# Patient Record
Sex: Male | Born: 1950 | Race: White | Hispanic: No | Marital: Married | Smoking: Never smoker
Health system: Southern US, Community
[De-identification: ages and names within clinical notes are randomized; demographics above are authoritative.]

## PROBLEM LIST (undated history)

## (undated) DIAGNOSIS — I493 Ventricular premature depolarization: Secondary | ICD-10-CM

## (undated) DIAGNOSIS — B1910 Unspecified viral hepatitis B without hepatic coma: Secondary | ICD-10-CM

## (undated) HISTORY — PX: VASECTOMY REVERSAL: SHX243

## (undated) HISTORY — PX: HERNIA REPAIR: SHX51

## (undated) HISTORY — PX: VASECTOMY: SHX75

---

## 2016-06-01 ENCOUNTER — Encounter: Payer: Self-pay | Admitting: *Deleted

## 2016-06-01 ENCOUNTER — Ambulatory Visit (INDEPENDENT_AMBULATORY_CARE_PROVIDER_SITE_OTHER): Payer: Worker's Compensation

## 2016-06-01 ENCOUNTER — Ambulatory Visit
Admission: EM | Admit: 2016-06-01 | Discharge: 2016-06-01 | Disposition: A | Discharge status: Home / Self Care | Payer: Worker's Compensation | Attending: Internal Medicine | Admitting: Internal Medicine

## 2016-06-01 DIAGNOSIS — S66912A Strain of unspecified muscle, fascia and tendon at wrist and hand level, left hand, initial encounter: Secondary | ICD-10-CM | POA: Diagnosis not present

## 2016-06-01 HISTORY — DX: Ventricular premature depolarization: I49.3

## 2016-06-01 NOTE — ED Provider Notes (Signed)
[facility]    CSN: [id number] Arrival date & time: 06/01/16  1220  First Provider Contact:  First MD Initiated Contact with Patient 06/01/16 1326        History   Chief Complaint Chief Complaint  Patient presents with  . Wrist Injury    HPI Benjamin Hood is a 65 y.o. male. He tripped over an obstacle at work and fell on 05/16/16, catching his fall with L hand.  Persistent pain/swelling in ulnar aspect of L hand/wrist. Able to move wrist. No other injuries reported.    HPI  Past Medical History:  Diagnosis Date  . PVC (premature ventricular contraction)      Past Surgical History:  Procedure Laterality Date  . HERNIA REPAIR Right    inguinal       Home Medications    Prior to Admission medications   Medication Sig Start Date End Date Taking? Authorizing Provider  zolpidem (AMBIEN) 10 MG tablet Take 10 mg by mouth at bedtime as needed for sleep.   Yes Historical Provider, MD    Family History History reviewed. No pertinent family history.  Social History Social History  Substance Use Topics  . Smoking status: Never Smoker  . Smokeless tobacco: Never Used  . Alcohol use Yes     Allergies   Review of patient's allergies indicates no known allergies.   Review of Systems Review of Systems  All other systems reviewed and are negative.    Physical Exam Triage Vital Signs ED Triage Vitals  Enc Vitals Group     BP 06/01/16 1248 126/72     Pulse Rate 06/01/16 1248 70     Resp 06/01/16 1248 18     Temp 06/01/16 1248 98.2 F (36.8 C)     Temp Source 06/01/16 1248 Oral     SpO2 06/01/16 1248 98 %     Weight 06/01/16 1250 194 lb (88 kg)     Height 06/01/16 1250 6' (1.829 m)     Pain Score 06/01/16 1255 5   Updated Vital Signs BP 126/72 (BP Location: Left Arm)   Pulse 70   Temp 98.2 F (36.8 C) (Oral)   Resp 18   Ht 6' (1.829 m)   Wt 194 lb (88 kg)   SpO2 98%   BMI 26.31 kg/m  Physical Exam  Constitutional: He is oriented to  person, place, and time. No distress.  Alert, nicely groomed  HENT:  Head: Atraumatic.  Eyes:  Conjugate gaze, no eye redness/drainage  Neck: Neck supple.  Cardiovascular: Normal rate.   Pulmonary/Chest: No respiratory distress.  Abdominal: He exhibits no distension.  Musculoskeletal: Normal range of motion.  Faint swelling/equivocal faint bruising at proximal ulnar hand and ulnar aspect of wrist.  Good ROM at wrist, rotation and flex/extension.  No focal bony tenderness.    Neurological: He is alert and oriented to person, place, and time.  Skin: Skin is warm and dry.  No cyanosis  Nursing note and vitals reviewed.    UC Treatments / Results   Radiology Dg Wrist Complete Left  Result Date: 06/01/2016 CLINICAL DATA:  Tripped over the optic and fell 4 on at outstretched hand. Pain in the medial side of the wrist radiates to pinky finger. Initial encounter. EXAM: LEFT WRIST - COMPLETE 3+ VIEW COMPARISON:  None. FINDINGS: There is no evidence of fracture or dislocation. There is no evidence of arthropathy or other focal bone abnormality. Soft tissues are unremarkable. IMPRESSION: Negative. Electronically Signed  By: Kennith CenterEric  Mansell M.D.   On: 06/01/2016 13:55    Procedures Procedures (including critical care time) Cockup splint applied by nurse  Final Clinical Impressions(s) / UC Diagnoses   Final diagnoses:  Strain of left wrist, initial encounter  trial of splinting x 10d when up and around.  Followup ortho if pain/swelling not improved after that time.  OTC ibuprofen as needed for pain.       Eustace MooreLaura W Tracyann Duffell, MD 06/01/16 1537

## 2016-06-01 NOTE — Discharge Instructions (Signed)
Wear splint when up and around for the next 10 days, to rest it and give it a chance to heal.  If pain/swelling not improving, follow up with orthopedics.  Ibuprofen otc as needed for discomfort.

## 2016-06-01 NOTE — ED Triage Notes (Signed)
Patient injured his left wrist when he fell and caught himself with his left hand. This occurred while at work on 05/16/16. Patient does not have any previous history of left wrist injuries.

## 2016-12-24 ENCOUNTER — Ambulatory Visit
Admission: EM | Admit: 2016-12-24 | Discharge: 2016-12-24 | Disposition: A | Discharge status: Home / Self Care | Payer: BLUE CROSS/BLUE SHIELD | Attending: Family Medicine | Admitting: Family Medicine

## 2016-12-24 ENCOUNTER — Encounter: Payer: Self-pay | Admitting: *Deleted

## 2016-12-24 DIAGNOSIS — H6983 Other specified disorders of Eustachian tube, bilateral: Secondary | ICD-10-CM

## 2016-12-24 DIAGNOSIS — H6692 Otitis media, unspecified, left ear: Secondary | ICD-10-CM

## 2016-12-24 DIAGNOSIS — J0100 Acute maxillary sinusitis, unspecified: Secondary | ICD-10-CM | POA: Diagnosis not present

## 2016-12-24 MED ORDER — LORATADINE-PSEUDOEPHEDRINE ER 10-240 MG PO TB24: 1.0000 | ORAL_TABLET | Freq: Every day | ORAL | 0 refills | Status: DC

## 2016-12-24 MED ORDER — AMOXICILLIN-POT CLAVULANATE 875-125 MG PO TABS: 1.0000 | ORAL_TABLET | Freq: Two times a day (BID) | ORAL | 0 refills | Status: DC

## 2016-12-24 MED ORDER — PREDNISONE 10 MG (21) PO TBPK: ORAL_TABLET | ORAL | 0 refills | Status: DC

## 2016-12-24 NOTE — ED Provider Notes (Signed)
[facility]    CSN: [id number] Arrival date & time: 12/24/16  1110     History   Chief Complaint Chief Complaint  Patient presents with  . Otalgia  . Nasal Congestion    HPI Dallie DadDennis Vantil is a 66 y.o. male.   Patient is a 66 year old white male who is complaining of bilateral ear pains. He states that he started noticing nasal congestion when [place] in [place]. He try to preload himself Friday with some Benadryl before the slight back home. Is a 5 hour flight and with extension and descending part of the slight he was in misery. States the throbbing has continued. Both ears hurt. Feels pressure congestion and still has not improved after landing on Saturday and this is Monday. He takes Flonase on a regular basis is still not been able to improve the symptoms. No known drug allergies. He's had a history of some PVCs. He's had hernia repair and nasal  septal surgery he does not smoke. No one smokes around him and there is no pertinent family medical history relevant to today's visit.        The history is provided by the patient and a significant other. No language interpreter was used.  Otalgia  Location:  Bilateral Behind ear:  No abnormality Quality:  Dull and pressure Severity:  Moderate Timing:  Constant Progression:  Worsening Chronicity:  New Relieved by:  Nothing Worsened by:  Nothing Ineffective treatments:  None tried Associated symptoms: congestion   Associated symptoms: no sore throat and no tinnitus     Past Medical History:  Diagnosis Date  . PVC (premature ventricular contraction)     There are no active problems to display for this patient.   Past Surgical History:  Procedure Laterality Date  . HERNIA REPAIR Right    inguinal       Home Medications    Prior to Admission medications   Medication Sig Start Date End Date Taking? Authorizing Provider  amoxicillin-clavulanate (AUGMENTIN) 875-125 MG tablet Take 1  tablet by mouth 2 (two) times daily. 12/24/16   Hassan RowanEugene Jameia Makris, MD  loratadine-pseudoephedrine (CLARITIN-D 24 HOUR) 10-240 MG 24 hr tablet Take 1 tablet by mouth daily. 12/24/16   Hassan RowanEugene Yashas Camilli, MD  predniSONE (STERAPRED UNI-PAK 21 TAB) 10 MG (21) TBPK tablet Sig 6 tablet day 1, 5 tablets day 2, 4 tablets day 3,,3tablets day 4, 2 tablets day 5, 1 tablet day 6 take all tablets orally 12/24/16   Hassan RowanEugene Marvette Schamp, MD  zolpidem (AMBIEN) 10 MG tablet Take 10 mg by mouth at bedtime as needed for sleep.    Historical Provider, MD    Family History History reviewed. No pertinent family history.  Social History Social History  Substance Use Topics  . Smoking status: Never Smoker  . Smokeless tobacco: Never Used  . Alcohol use Yes     Allergies   Patient has no known allergies.   Review of Systems Review of Systems  Constitutional: Negative for activity change.  HENT: Positive for congestion, ear pain and sneezing. Negative for sore throat, tinnitus, trouble swallowing and voice change.   Eyes: Negative for visual disturbance.  All other systems reviewed and are negative.    Physical Exam Triage Vital Signs ED Triage Vitals  Enc Vitals Group     BP 12/24/16 1211 (!) 132/94     Pulse Rate 12/24/16 1211 (!) 56     Resp 12/24/16 1211 16     Temp 12/24/16 1211  98 F (36.7 C)     Temp Source 12/24/16 1211 Oral     SpO2 12/24/16 1211 99 %     Weight 12/24/16 1213 190 lb (86.2 kg)     Height 12/24/16 1213 5\' 11"  (1.803 m)     Head Circumference --      Peak Flow --      Pain Score --      Pain Loc --      Pain Edu? --      Excl. in GC? --    No data found.   Updated Vital Signs BP (!) 132/94 (BP Location: Left Arm)   Pulse (!) 56   Temp 98 F (36.7 C) (Oral)   Resp 16   Ht 5\' 11"  (1.803 m)   Wt 190 lb (86.2 kg)   SpO2 99%   BMI 26.50 kg/m   Visual Acuity Right Eye Distance:   Left Eye Distance:   Bilateral Distance:    Right Eye Near:   Left Eye Near:    Bilateral Near:       Physical Exam  Constitutional: He appears well-developed and well-nourished.  HENT:  Head: Normocephalic and atraumatic.  Right Ear: Hearing, external ear and ear canal normal. Tympanic membrane is injected.  Left Ear: Hearing, external ear and ear canal normal. Tympanic membrane is injected and erythematous.  Nose: Sinus tenderness present. Right sinus exhibits maxillary sinus tenderness. Right sinus exhibits no frontal sinus tenderness. Left sinus exhibits maxillary sinus tenderness. Left sinus exhibits no frontal sinus tenderness.  Mouth/Throat: Uvula is midline. No uvula swelling. Posterior oropharyngeal erythema present.  Vitals reviewed.    UC Treatments / Results  Labs (all labs ordered are listed, but only abnormal results are displayed) Labs Reviewed - No data to display  EKG  EKG Interpretation None       Radiology No results found.  Procedures Procedures (including critical care time)  Medications Ordered in UC Medications - No data to display   Initial Impression / Assessment and Plan / UC Course  I have reviewed the triage vital signs and the nursing notes.  Pertinent labs & imaging results that were available during my care of the patient were reviewed by me and considered in my medical decision making (see chart for details).   patient uses Flonase nasal spray on regular basis and prednisone 6 day course Augmentin 875 one tablet twice a day and Claritin-D 1 tablet daily for the next 30 days. Patient crown work note will treat for eustachian tube dysfunction sinusitis and the left otitis media    Final Clinical Impressions(s) / UC Diagnoses   Final diagnoses:  Acute maxillary sinusitis, recurrence not specified  Acute left otitis media  Dysfunction of both eustachian tubes    New Prescriptions New Prescriptions   AMOXICILLIN-CLAVULANATE (AUGMENTIN) 875-125 MG TABLET    Take 1 tablet by mouth 2 (two) times daily.   LORATADINE-PSEUDOEPHEDRINE  (CLARITIN-D 24 HOUR) 10-240 MG 24 HR TABLET    Take 1 tablet by mouth daily.   PREDNISONE (STERAPRED UNI-PAK 21 TAB) 10 MG (21) TBPK TABLET    Sig 6 tablet day 1, 5 tablets day 2, 4 tablets day 3,,3tablets day 4, 2 tablets day 5, 1 tablet day 6 take all tablets orally     Note: This dictation was prepared with Dragon dictation along with smaller phrase technology. Any transcriptional errors that result from this process are unintentional.   Hassan Rowan, MD 12/24/16 1418

## 2016-12-24 NOTE — ED Triage Notes (Signed)
Head congestion, bilat ear pain and fullness, headache, since 5 hour flight Friday night.

## 2017-11-15 ENCOUNTER — Ambulatory Visit (INDEPENDENT_AMBULATORY_CARE_PROVIDER_SITE_OTHER): Payer: BLUE CROSS/BLUE SHIELD

## 2017-11-15 ENCOUNTER — Ambulatory Visit
Admission: EM | Admit: 2017-11-15 | Discharge: 2017-11-15 | Disposition: A | Discharge status: Home / Self Care | Payer: BLUE CROSS/BLUE SHIELD | Attending: Family Medicine | Admitting: Family Medicine

## 2017-11-15 ENCOUNTER — Encounter: Payer: Self-pay | Admitting: *Deleted

## 2017-11-15 DIAGNOSIS — S52501A Unspecified fracture of the lower end of right radius, initial encounter for closed fracture: Secondary | ICD-10-CM

## 2017-11-15 DIAGNOSIS — W19XXXA Unspecified fall, initial encounter: Secondary | ICD-10-CM | POA: Diagnosis not present

## 2017-11-15 MED ORDER — TRAMADOL HCL 50 MG PO TABS: 50.0000 mg | ORAL_TABLET | Freq: Three times a day (TID) | ORAL | 0 refills | Status: DC | PRN

## 2017-11-15 NOTE — ED Provider Notes (Signed)
[facility]    CSN: [id number] Arrival date & time: 11/15/17  1802  History   Chief Complaint Chief Complaint  Patient presents with  . Arm Injury   HPI  67 year old male presents with an arm injury.  Patient states that he was moving a log and tripped on a root.  He subsequently fell back and injured his right forearm and wrist.  Pain is predominantly of the right wrist.  Swelling in this area.  Pain reportedly 8/10 in severity.  He iced the area immediately and then came in for evaluation.  Decreased range of motion.  Swelling.  Sensation intact.  Exacerbated by activity.  No relieving factors.  No other associated symptoms.  No other complaints at this time.  Past Medical History:  Diagnosis Date  . PVC (premature ventricular contraction)    Past Surgical History:  Procedure Laterality Date  . HERNIA REPAIR Right    inguinal   Home Medications    Prior to Admission medications   Medication Sig Start Date End Date Taking? Authorizing Provider  traMADol (ULTRAM) 50 MG tablet Take 1 tablet (50 mg total) by mouth every 8 (eight) hours as needed. 11/15/17   Tommie Sams, DO   Family History History reviewed. No pertinent family history.  Social History Social History   Tobacco Use  . Smoking status: Never Smoker  . Smokeless tobacco: Never Used  Substance Use Topics  . Alcohol use: Yes  . Drug use: No    Allergies   Patient has no known allergies.   Review of Systems Review of Systems  Constitutional: Negative.   Musculoskeletal:       Right wrist and forearm pain.  Swelling, decreased range of motion.   Physical Exam Triage Vital Signs ED Triage Vitals  Enc Vitals Group     BP 11/15/17 1818 (!) 143/76     Pulse Rate 11/15/17 1818 84     Resp 11/15/17 1818 12     Temp 11/15/17 1818 98.2 F (36.8 C)     Temp Source 11/15/17 1818 Oral     SpO2 11/15/17 1818 98 %     Weight 11/15/17 1821 193 lb (87.5 kg)     Height 11/15/17 1821 6' (1.829 m)       Head Circumference --      Peak Flow --      Pain Score 11/15/17 1820 8     Pain Loc --      Pain Edu? --      Excl. in GC? --    Updated Vital Signs BP (!) 143/76 (BP Location: Left Leg)   Pulse 84   Temp 98.2 F (36.8 C) (Oral)   Resp 12   Ht 6' (1.829 m)   Wt 193 lb (87.5 kg)   SpO2 98%   BMI 26.18 kg/m  Physical Exam  Constitutional: He is oriented to person, place, and time. He appears well-developed. No distress.  Pulmonary/Chest: Effort normal. No respiratory distress.  Musculoskeletal:  Right wrist -swelling noted on the radial side of the wrist, approximately.  Patient with tenderness palpation of the distal wrist.  Decreased range of motion in flexion and extension secondary to pain.  Patient has some tenderness or along the forearm but no discrete abnormalities.  Neurological: He is alert and oriented to person, place, and time.  Skin: Skin is warm. No rash noted.  Psychiatric: He has a normal mood and affect. His behavior is normal.  Nursing note and  vitals reviewed.  UC Treatments / Results  Labs (all labs ordered are listed, but only abnormal results are displayed) Labs Reviewed - No data to display  EKG  EKG Interpretation None       Radiology Dg Forearm Right  Result Date: 11/15/2017 CLINICAL DATA:  Right wrist pain post fall. EXAM: RIGHT FOREARM - 2 VIEW COMPARISON:  None. FINDINGS: Horizontal minimally displaced mildly comminuted fracture of the distal radius, with possible small intra-articular component. Associated soft tissue swelling. IMPRESSION: Horizontal minimally displaced mildly comminuted fracture of the distal radius, with possible small intra-articular component. Electronically Signed   By: Ted Mcalpineobrinka  Dimitrova M.D.   On: 11/15/2017 19:26   Dg Wrist Complete Right  Result Date: 11/15/2017 CLINICAL DATA:  Radial side of the wrist pain post fall. EXAM: RIGHT WRIST - COMPLETE 3+ VIEW COMPARISON:  None. FINDINGS: Horizontal minimally  displaced mildly comminuted fracture of the distal radius. Small intra-articular component cannot be excluded. The carpal rows are normally aligned. Associated soft tissue swelling. IMPRESSION: Horizontal minimally displaced mildly comminuted fracture of the distal radius, with possible small intra-articular component. Electronically Signed   By: Ted Mcalpineobrinka  Dimitrova M.D.   On: 11/15/2017 19:26    Procedures Procedures (including critical care time)  Medications Ordered in UC Medications - No data to display   Initial Impression / Assessment and Plan / UC Course  I have reviewed the triage vital signs and the nursing notes.  Pertinent labs & imaging results that were available during my care of the patient were reviewed by me and considered in my medical decision making (see chart for details).     67 year old male presents with a distal radius fracture. Placed in splint (sugar tong). Tramadol for pain. Advised to see Ortho (will call Poggi; he is friend).  Final Clinical Impressions(s) / UC Diagnoses   Final diagnoses:  Closed fracture of distal end of right radius, unspecified fracture morphology, initial encounter    ED Discharge Orders        Ordered    traMADol (ULTRAM) 50 MG tablet  Every 8 hours PRN     11/15/17 1941     Controlled Substance Prescriptions [place] Controlled Substance Registry consulted? Not Applicable   Tommie SamsCook, Alaura Schippers G, DO 11/15/17 2019

## 2017-11-15 NOTE — Discharge Instructions (Signed)
Rest.  Medication if needed.  Call Ortho.  Take care  Dr. Adriana Simasook

## 2017-11-15 NOTE — ED Triage Notes (Signed)
Pt felt today around 5 PM and landed on his right arm.  Pt has some swelling to  Right wrist and forearm area. Pain level 8/10.

## 2017-12-31 ENCOUNTER — Ambulatory Visit: Payer: BLUE CROSS/BLUE SHIELD | Attending: Surgery | Admitting: Occupational Therapy

## 2017-12-31 ENCOUNTER — Other Ambulatory Visit: Payer: Self-pay

## 2017-12-31 DIAGNOSIS — M25631 Stiffness of right wrist, not elsewhere classified: Secondary | ICD-10-CM | POA: Insufficient documentation

## 2017-12-31 DIAGNOSIS — M25641 Stiffness of right hand, not elsewhere classified: Secondary | ICD-10-CM | POA: Diagnosis present

## 2017-12-31 DIAGNOSIS — M6281 Muscle weakness (generalized): Secondary | ICD-10-CM | POA: Diagnosis present

## 2017-12-31 NOTE — Therapy (Signed)
[facility] [address], [place], [address] Phone: [phone]   Fax:  [phone]  Occupational Therapy Evaluation  Patient Details  Name: Benjamin Hood MRN: [id number] Date of Birth: 15-Jan-1951 Referring Provider: Joice Lofts   Encounter Date: 12/31/2017  OT End of Session - 12/31/17 1300    Visit Number  1    Number of Visits  12    Date for OT Re-Evaluation  02/11/18    OT Start Time  1156    OT Stop Time  1253    OT Time Calculation (min)  57 min    Activity Tolerance  Patient tolerated treatment well    Behavior During Therapy  [name] for tasks assessed/performed       Past Medical History:  Diagnosis Date  . PVC (premature ventricular contraction)     Past Surgical History:  Procedure Laterality Date  . HERNIA REPAIR Right    inguinal    There were no vitals filed for this visit.  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  No/denies        [facility] OT Assessment - 12/31/17 0001      Assessment   Medical Diagnosis  R distal readius     Referring Provider  Poggi    Onset Date/Surgical Date  11/15/17    Hand Dominance  Right    Next MD Visit  -- about middle April      Precautions   Required Braces or Orthoses  -- wrist splint       Home  Environment   Lives With  Spouse      Prior Function   Vocation  Full time employment      AROM   Right Forearm Pronation  75 Degrees    Right Forearm Supination  45 Degrees    Left Forearm Pronation  90 Degrees    Left Forearm Supination  90 Degrees    Right Wrist Extension  36  Degrees    Right Wrist Flexion  25 Degrees    Right Wrist Radial Deviation  18 Degrees    Right Wrist Ulnar Deviation  14 Degrees    Left Wrist Extension  70 Degrees    Left Wrist Flexion  74 Degrees    Left Wrist Radial Deviation  35 Degrees    Left Wrist Ulnar Deviation  22 Degrees      Right Hand AROM   R Thumb Opposition to Index  -- Opposition to 2nd fold of 5th -pull over thumb    R Index  MCP 0-90  70 Degrees    R Index PIP 0-100  90 Degrees    R Long  MCP 0-90  78 Degrees    R Long PIP 0-100  90 Degrees    R Ring  MCP 0-90  78 Degrees    R Ring PIP 0-100  95 Degrees    R Little  MCP 0-90  85 Degrees  R Little PIP 0-100  95 Degrees       fluidotherapy done for wrist and digits AROM in all planes to increase ROM  Prior to review of HEP      Contrast if possible  Tendon glides   Opposition - and slide down 5th   AAROM over edge of table for wrist flexion , ext, RD,UD  And PROM for supination  10 reps  keep pain under 2/10   AROM  For wrist in all planes   10 reps   2-3 x day             OT Education - 12/31/17 1300    Education provided  Yes    Education Details  findings of eval and HEP     Person(s) Educated  Patient    Methods  Explanation;Demonstration;Tactile cues;Verbal cues;Handout    Comprehension  Returned demonstration;Verbalized understanding       OT Short Term Goals - 12/31/17 1305      OT SHORT TERM GOAL #1   Title  Pt to be ind in HEP to increase AROM in R wrist  and digits to turn doorknob, brush teeth    Baseline  See flowsheet     Time  3    Period  Weeks    Status  New    Target Date  01/21/18      OT SHORT TERM GOAL #2   Title  R wrist AROM improve with more than 10-25 degrees to increase functional use of R hand to 75%     Baseline  USing R hand only 40% of time and see flowsheet for AROM for wrist     Time  4    Period  Weeks    Status  New    Target Date  01/28/18        OT Long Term Goals - 12/31/17 1310       OT LONG TERM GOAL #1   Title  Function score on PRWHE improve with more than 20 points     Baseline  Function score on PRWHE at eval 30/50     Time  6    Period  Weeks    Status  New    Target Date  02/11/18      OT LONG TERM GOAL #2   Title  R grip strength increase to more than 50% compare to L hand to carry more than 8 lbs, cut food, and  start back in working out in gym     Baseline  NT  - cast come off last week - ROM initiated this date     Time  6    Period  Weeks    Status  New    Target Date  02/11/18      OT LONG TERM GOAL #3   Title  R wrist strength increase to 4+/5 or more in all planes to return to using hand in work and gold, playing guitar and yard work without symptoms     Baseline  not using hand in act - only 40%     Time  6    Period  Weeks    Status  New    Target Date  02/11/18            Plan - 12/31/17 1301    Clinical Impression Statement  Pt present at OT evaluation 6 wks out from distal radius fx - was in cast until last week -  and now in velcro brace - pt do report some numbness at night time -  pt show decrease ROM in wrist in all planes , decrease MC and composite flexion of digits - decrease strength - all  limiting his functional use of R dominant hand  in ADL's and IADL's     Occupational performance deficits (Please refer to evaluation for details):  ADL's;IADL's;Work;Play;Leisure    Rehab Potential  Good    OT Frequency  2x / week    OT Duration  6 weeks    OT Treatment/Interventions  Self-care/ADL training;Patient/family education;Paraffin;Contrast Bath;Fluidtherapy;Manual Therapy;Passive range of motion;Therapeutic exercise    Plan   To assess progress with ROM HEP and add strengthening     Clinical Decision Making  Several treatment options, min-mod task modification necessary    OT Home Exercise Plan  see pt instruction    Consulted and Agree with Plan of Care  Patient       Patient will benefit from skilled therapeutic  intervention in order to improve the following deficits and impairments:  Pain, Impaired flexibility, Increased edema, Decreased strength, Decreased range of motion, Impaired UE functional use  Visit Diagnosis: Stiffness of right wrist, not elsewhere classified - Plan: Ot plan of care cert/re-cert  Stiffness of right hand, not elsewhere classified - Plan: Ot plan of care cert/re-cert  Muscle weakness (generalized) - Plan: Ot plan of care cert/re-cert    Problem List There are no active problems to display for this patient.   Oletta CohnuPreez, Benjamin Hood OTR/L,CLT 12/31/2017, 1:16 PM  [facility] [address] [place], [place], [address] Phone: [phone]   Fax:  [phone]  Name: Benjamin Hood MRN: [id number] Date of Birth: 05/18/1951

## 2017-12-31 NOTE — Patient Instructions (Signed)
Contrast if possible  Tendon glides   Opposition - and slide down 5th   AAROM over edge of table for wrist flexion , ext, RD,UD  And PROM for supination  10 reps  keep pain under 2/10   AROM  For wrist in all planes   10 reps   2-3 x day

## 2018-01-10 ENCOUNTER — Ambulatory Visit: Payer: BLUE CROSS/BLUE SHIELD | Admitting: Occupational Therapy

## 2018-01-17 ENCOUNTER — Ambulatory Visit: Payer: BLUE CROSS/BLUE SHIELD | Admitting: Occupational Therapy

## 2018-01-24 ENCOUNTER — Ambulatory Visit: Payer: BLUE CROSS/BLUE SHIELD | Admitting: Occupational Therapy

## 2018-03-30 IMAGING — CR DG WRIST COMPLETE 3+V*R*
4 series · 4 of 4 positions shown · non-contrast
Comparison: None.

CLINICAL DATA: Radial side of the wrist pain post fall.

EXAM:
RIGHT WRIST - COMPLETE 3+ VIEW

[wrist pa]
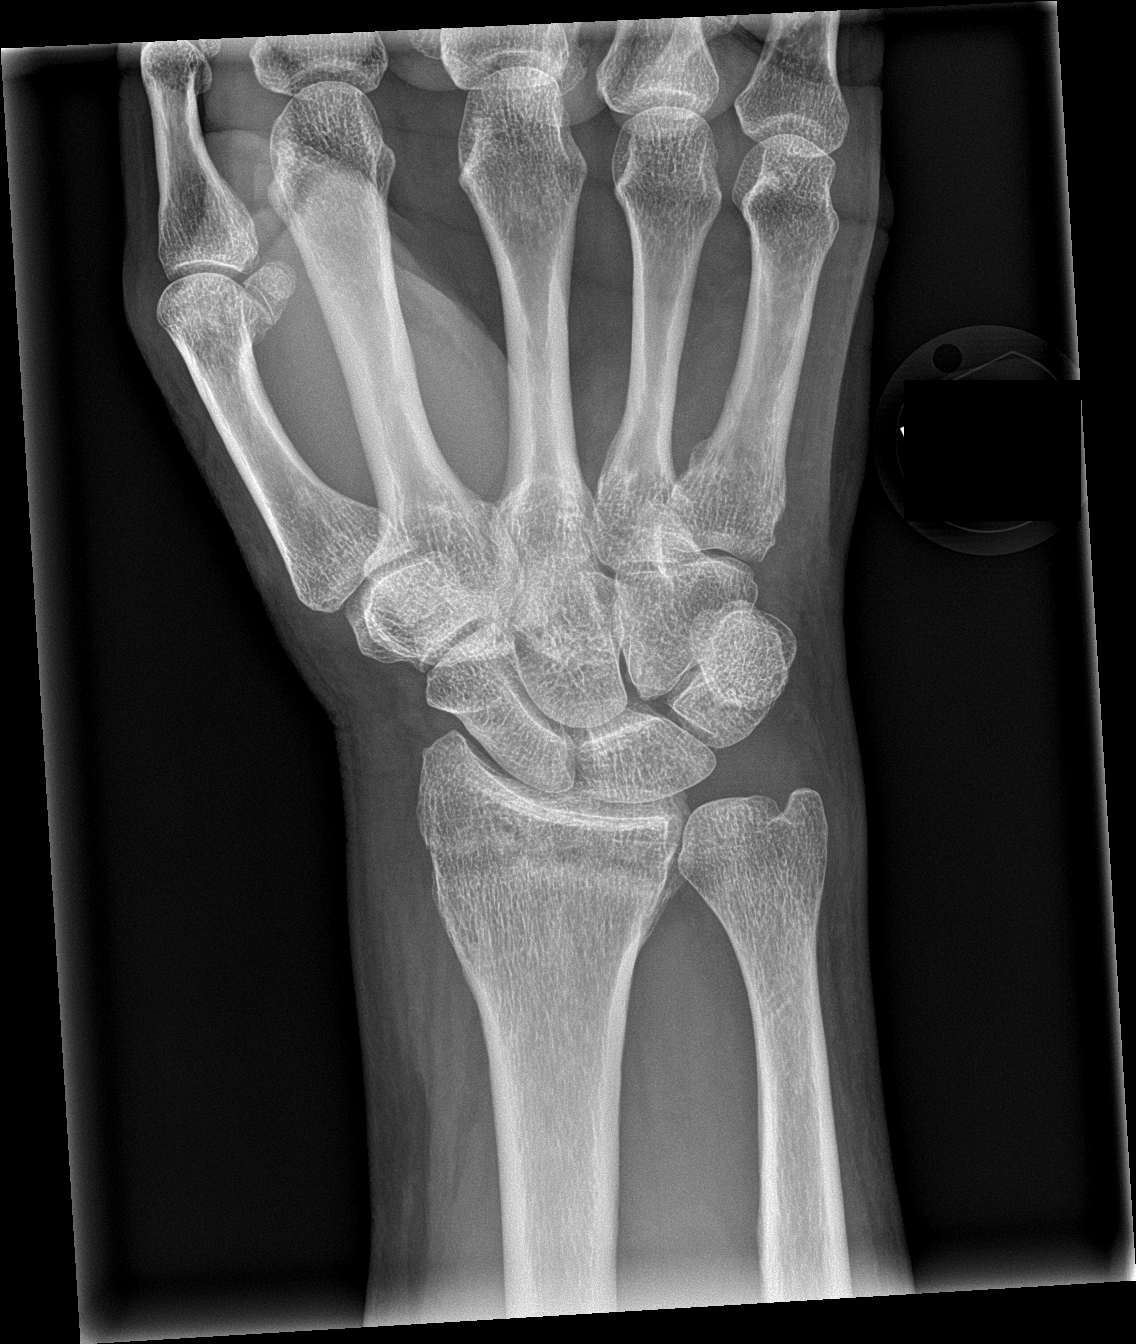

[wrist obl]
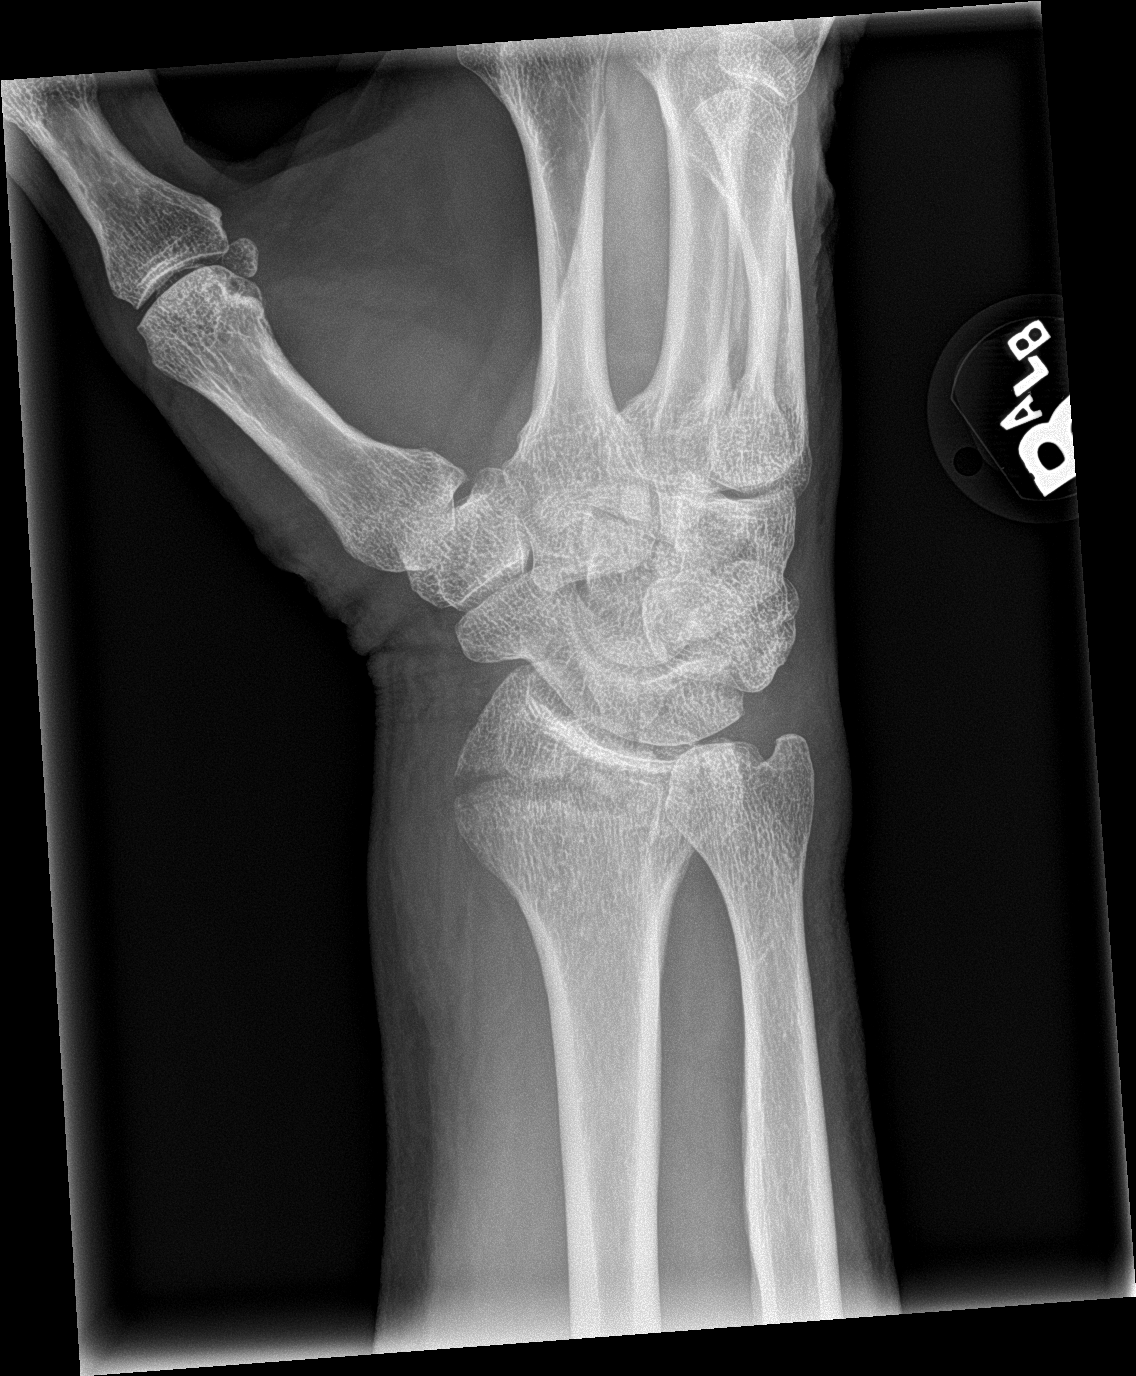

[wrist lat]
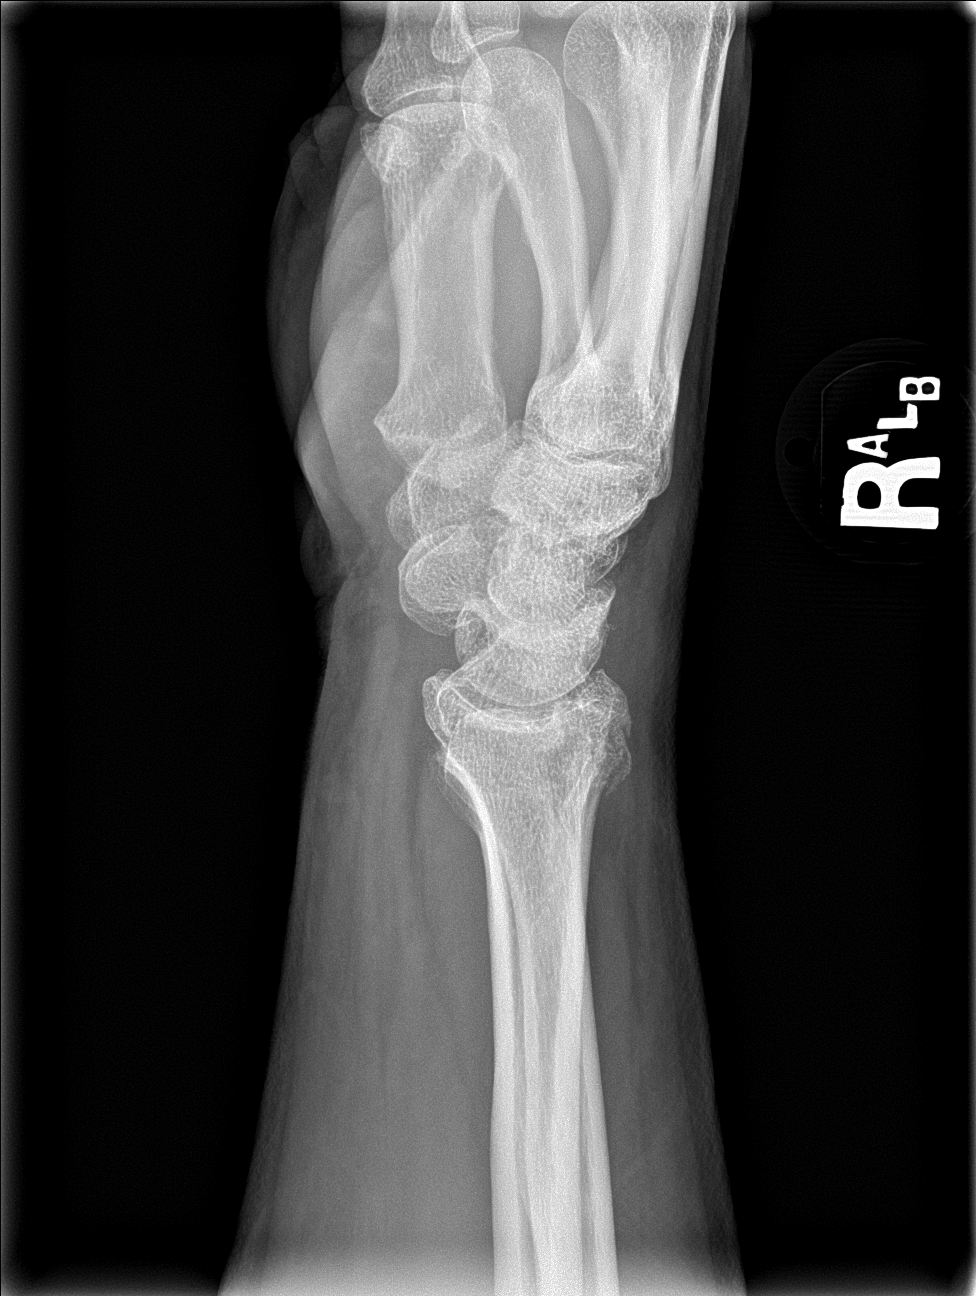

[wrist navicular]
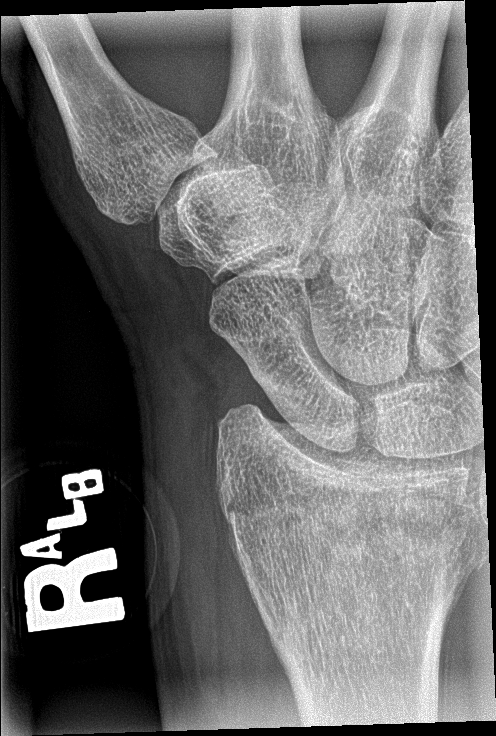

[4 of 4 positions shown; findings below may reference images not displayed]

FINDINGS: Horizontal minimally displaced mildly comminuted fracture of the
distal radius. Small intra-articular component cannot be excluded.
The carpal rows are normally aligned. Associated soft tissue
swelling.
IMPRESSION: Horizontal minimally displaced mildly comminuted fracture of the
distal radius, with possible small intra-articular component.

## 2021-01-09 ENCOUNTER — Other Ambulatory Visit: Payer: Self-pay

## 2021-01-09 ENCOUNTER — Emergency Department: Payer: Medicare HMO

## 2021-01-09 ENCOUNTER — Emergency Department
Admission: EM | Admit: 2021-01-09 | Discharge: 2021-01-09 | Disposition: A | Discharge status: Home / Self Care | Payer: Medicare HMO | Attending: Emergency Medicine | Admitting: Emergency Medicine

## 2021-01-09 ENCOUNTER — Encounter: Payer: Self-pay | Admitting: Emergency Medicine

## 2021-01-09 DIAGNOSIS — R1011 Right upper quadrant pain: Secondary | ICD-10-CM | POA: Diagnosis present

## 2021-01-09 DIAGNOSIS — Z91040 Latex allergy status: Secondary | ICD-10-CM | POA: Diagnosis not present

## 2021-01-09 HISTORY — DX: Unspecified viral hepatitis B without hepatic coma: B19.10

## 2021-01-09 LAB — CBC
HCT: 44.2 % (ref 39.0–52.0)
Hemoglobin: 15.4 g/dL (ref 13.0–17.0)
MCH: 32.2 pg (ref 26.0–34.0)
MCHC: 34.8 g/dL (ref 30.0–36.0)
MCV: 92.5 fL (ref 80.0–100.0)
Platelets: 207 10*3/uL (ref 150–400)
RBC: 4.78 MIL/uL (ref 4.22–5.81)
RDW: 12.4 % (ref 11.5–15.5)
WBC: 5.4 10*3/uL (ref 4.0–10.5)
nRBC: 0 % (ref 0.0–0.2)

## 2021-01-09 LAB — URINALYSIS, COMPLETE (UACMP) WITH MICROSCOPIC
Bacteria, UA: NONE SEEN
Bilirubin Urine: NEGATIVE
Glucose, UA: NEGATIVE mg/dL
Hgb urine dipstick: NEGATIVE
Ketones, ur: NEGATIVE mg/dL
Leukocytes,Ua: NEGATIVE
Nitrite: NEGATIVE
Protein, ur: NEGATIVE mg/dL
Specific Gravity, Urine: 1.019 (ref 1.005–1.030)
Squamous Epithelial / LPF: NONE SEEN (ref 0–5)
pH: 5 (ref 5.0–8.0)

## 2021-01-09 LAB — COMPREHENSIVE METABOLIC PANEL
ALT: 29 U/L (ref 0–44)
AST: 30 U/L (ref 15–41)
Albumin: 4.1 g/dL (ref 3.5–5.0)
Alkaline Phosphatase: 49 U/L (ref 38–126)
Anion gap: 7 (ref 5–15)
BUN: 38 mg/dL — ABNORMAL HIGH (ref 8–23)
CO2: 23 mmol/L (ref 22–32)
Calcium: 9.1 mg/dL (ref 8.9–10.3)
Chloride: 107 mmol/L (ref 98–111)
Creatinine, Ser: 1.35 mg/dL — ABNORMAL HIGH (ref 0.61–1.24)
GFR, Estimated: 57 mL/min — ABNORMAL LOW (ref >60)
Glucose, Bld: 113 mg/dL — ABNORMAL HIGH (ref 70–99)
Potassium: 4.3 mmol/L (ref 3.5–5.1)
Sodium: 137 mmol/L (ref 135–145)
Total Bilirubin: 0.8 mg/dL (ref 0.3–1.2)
Total Protein: 7.2 g/dL (ref 6.5–8.1)

## 2021-01-09 LAB — LIPASE, BLOOD: Lipase: 44 U/L (ref 11–51)

## 2021-01-09 MED ORDER — DICYCLOMINE HCL 10 MG PO CAPS: 10.0000 mg | ORAL_CAPSULE | Freq: Four times a day (QID) | ORAL | 0 refills | Status: AC

## 2021-01-09 NOTE — ED Notes (Signed)
See triage note  Presents with RUQ pain  States pain is mainly under right rib area  States he has had similar pain for several months which has been intermittent  But pain has been sharp over the past 2 weeks

## 2021-01-09 NOTE — ED Triage Notes (Addendum)
Pt present to the ED c/o RUQ abdominal pain that has been going on intermittently past couple months. It has gotten worse the past two weeks. Pt states that it is a pressure that intermittently becomes sharp. Denies NVD. Denies fever. Pt is A&Ox4 and NAD.   Pt has a hx of Hepatitis B

## 2021-01-09 NOTE — ED Provider Notes (Signed)
[facility] Emergency Department Provider Note   ____________________________________________   Event Date/Time   First MD Initiated Contact with Patient 01/09/21 1330     (approximate)  I have reviewed the triage vital signs and the nursing notes.   HISTORY  Chief Complaint Abdominal Pain    HPI Benjamin Hood is a 70 y.o. male with a stated past medical history of hepatitis B and chronic kidney disease who presents for right upper quadrant pain that is intermittent, 5/10 in intensity, and does not radiate.  Patient states that that this pain has been occurring over the last couple months however has been worsened over the past 2 weeks.  Patient does state that it gets slightly worsened after a meal.  Patient denies any history of gallbladder issues.  Patient currently denies any vision changes, tinnitus, difficulty speaking, facial droop, sore throat, chest pain, shortness of breath, nausea/vomiting/diarrhea, dysuria, or weakness/numbness/paresthesias in any extremity         Past Medical History:  Diagnosis Date  . Hepatitis B   . PVC (premature ventricular contraction)     There are no problems to display for this patient.   Past Surgical History:  Procedure Laterality Date  . HERNIA REPAIR Right    inguinal  . VASECTOMY    . VASECTOMY REVERSAL      Prior to Admission medications   Medication Sig Start Date End Date Taking? Authorizing Provider  dicyclomine (BENTYL) 10 MG capsule Take 1 capsule (10 mg total) by mouth 4 (four) times daily for 3 days. 01/09/21 01/12/21 Yes Merwyn Katos, MD    Allergies Latex  No family history on file.  Social History Social History   Tobacco Use  . Smoking status: Never Smoker  . Smokeless tobacco: Never Used  Substance Use Topics  . Alcohol use: Yes  . Drug use: No    Review of Systems Constitutional: No fever/chills Eyes: No visual changes. ENT: No sore throat. Cardiovascular: Denies  chest pain. Respiratory: Denies shortness of breath. Gastrointestinal: Endorses abdominal pain.  No nausea, no vomiting.  No diarrhea. Genitourinary: Negative for dysuria. Musculoskeletal: Negative for acute arthralgias Skin: Negative for rash. Neurological: Negative for headaches, weakness/numbness/paresthesias in any extremity Psychiatric: Negative for suicidal ideation/homicidal ideation   ____________________________________________   PHYSICAL EXAM:  VITAL SIGNS: ED Triage Vitals  Enc Vitals Group     BP 01/09/21 1308 140/84     Pulse Rate 01/09/21 1308 72     Resp 01/09/21 1308 20     Temp 01/09/21 1308 98.3 F (36.8 C)     Temp Source 01/09/21 1308 Oral     SpO2 01/09/21 1308 95 %     Weight 01/09/21 1309 190 lb (86.2 kg)     Height 01/09/21 1309 5\' 10"  (1.778 m)     Head Circumference --      Peak Flow --      Pain Score 01/09/21 1309 5     Pain Loc --      Pain Edu? --      Excl. in GC? --    Constitutional: Alert and oriented. Well appearing and in no acute distress. Eyes: Conjunctivae are normal. PERRL. Head: Atraumatic. Nose: No congestion/rhinnorhea. Mouth/Throat: Mucous membranes are moist. Neck: No stridor Cardiovascular: Grossly normal heart sounds.  Good peripheral circulation. Respiratory: Normal respiratory effort.  No retractions. Gastrointestinal: Soft and nontender. No distention. Musculoskeletal: No obvious deformities Neurologic:  Normal speech and language. No gross focal neurologic deficits are appreciated.  Skin:  Skin is warm and dry. No rash noted. Psychiatric: Mood and affect are normal. Speech and behavior are normal.  ____________________________________________   LABS (all labs ordered are listed, but only abnormal results are displayed)  Labs Reviewed  COMPREHENSIVE METABOLIC PANEL - Abnormal; Notable for the following components:      Result Value   Glucose, Bld 113 (*)    BUN 38 (*)    Creatinine, Ser 1.35 (*)    GFR,  Estimated 57 (*)    All other components within normal limits  URINALYSIS, COMPLETE (UACMP) WITH MICROSCOPIC - Abnormal; Notable for the following components:   Color, Urine YELLOW (*)    APPearance CLEAR (*)    All other components within normal limits  LIPASE, BLOOD  CBC   ____________________________________________  EKG  ED ECG REPORT I, Merwyn Katos, the attending physician, personally viewed and interpreted this ECG.  Date: 01/09/2021 EKG Time: 1313 Rate: 65 Rhythm: normal sinus rhythm QRS Axis: normal Intervals: normal ST/T Wave abnormalities: normal Narrative Interpretation: no evidence of acute ischemia  ____________________________________________  RADIOLOGY  ED MD interpretation: Right upper quadrant ultrasound shows no evidence of gallstones, gallbladder disease, or dilated ducts.  Incidentally shows hepatic steatosis  Official radiology report(s): US Abdomen Limited RUQ (LIVER/GB)  Result Date: 01/09/2021 CLINICAL DATA:  Right upper quadrant pain EXAM: ULTRASOUND ABDOMEN LIMITED RIGHT UPPER QUADRANT COMPARISON:  None. FINDINGS: Gallbladder: No gallstones or wall thickening visualized. No sonographic Murphy sign noted by sonographer. Common bile duct: Diameter: 3.1 mm Liver: Liver is echogenic. Cysts in the right hepatic lobe measuring 2.5 x 3.2 x 2.2 cm and 0.9 x 0.7 x 0.8 cm. Portal vein is patent on color Doppler imaging with normal direction of blood flow towards the liver. Other: None. IMPRESSION: 1. Negative for gallstones. 2. Echogenic liver consistent with steatosis.  Hepatic cysts Electronically Signed   By: Jasmine Pang M.D.   On: 01/09/2021 17:25    ____________________________________________   PROCEDURES  Procedure(s) performed (including Critical Care):  .1-3 Lead EKG Interpretation Performed by: Merwyn Katos, MD Authorized by: Merwyn Katos, MD     Interpretation: normal     ECG rate:  69   ECG rate assessment: normal     Rhythm:  sinus rhythm     Ectopy: none     Conduction: normal       ____________________________________________   INITIAL IMPRESSION / ASSESSMENT AND PLAN / ED COURSE  As part of my medical decision making, I reviewed the following data within the electronic MEDICAL RECORD NUMBER Nursing notes reviewed and incorporated, Labs reviewed, EKG interpreted, Old chart reviewed, Radiograph reviewed and Notes from prior ED visits reviewed and incorporated      Patient presents for abdominal pain.  Differential diagnosis includes appendicitis, abdominal aortic aneurysm, surgical biliary disease, pancreatitis, SBO, mesenteric ischemia, serious intra-abdominal bacterial illness, genital torsion. Doubt atypical ACS. Based on history, physical exam, radiologic/laboratory evaluation, there is no red flag results or symptomatology requiring emergent intervention or need for admission at this time Pt tolerating PO. Disposition: Patient will be discharged with strict return precautions and follow up with primary MD within 12-24 hours for further evaluation. Patient understands that this still may have an early presentation of an emergent medical condition such as appendicitis that will require a recheck.      ____________________________________________   FINAL CLINICAL IMPRESSION(S) / ED DIAGNOSES  Final diagnoses:  RUQ pain  Colicky right upper quadrant pain     ED Discharge  Orders         Ordered    dicyclomine (BENTYL) 10 MG capsule  4 times daily        01/09/21 1753           Note:  This document was prepared using Dragon voice recognition software and may include unintentional dictation errors.   Merwyn Katos, MD 01/09/21 [phone]

## 2021-01-09 NOTE — ED Notes (Signed)
Blood collected by other provider during triage. Pt to be brought back to room soon.

## 2021-06-01 ENCOUNTER — Ambulatory Visit
Admission: EM | Admit: 2021-06-01 | Discharge: 2021-06-01 | Disposition: A | Discharge status: Home / Self Care | Payer: Medicare HMO

## 2021-06-01 ENCOUNTER — Encounter: Payer: Self-pay | Admitting: Emergency Medicine

## 2021-06-01 ENCOUNTER — Other Ambulatory Visit: Payer: Self-pay

## 2021-06-01 DIAGNOSIS — R059 Cough, unspecified: Secondary | ICD-10-CM | POA: Diagnosis not present

## 2021-06-01 MED ORDER — PROMETHAZINE-DM 6.25-15 MG/5ML PO SYRP: 5.0000 mL | ORAL_SOLUTION | Freq: Four times a day (QID) | ORAL | 0 refills | Status: DC | PRN

## 2021-06-01 MED ORDER — AEROCHAMBER MV MISC: 2 refills | Status: AC

## 2021-06-01 MED ORDER — ALBUTEROL SULFATE HFA 108 (90 BASE) MCG/ACT IN AERS: 2.0000 | INHALATION_SPRAY | RESPIRATORY_TRACT | 0 refills | Status: DC | PRN

## 2021-06-01 MED ORDER — AZITHROMYCIN 250 MG PO TABS: 250.0000 mg | ORAL_TABLET | Freq: Every day | ORAL | 0 refills | Status: DC

## 2021-06-01 MED ORDER — BENZONATATE 100 MG PO CAPS: 200.0000 mg | ORAL_CAPSULE | Freq: Three times a day (TID) | ORAL | 0 refills | Status: DC

## 2021-06-01 NOTE — Discharge Instructions (Signed)
Take the Z-pack as directed.  Use the Tessalon Perles every 8 hours during the day.  Take them with a small sip of water.  They may give you some numbness to the base of your tongue or a metallic taste in your mouth, this is normal.  Use the Promethazine DM cough syrup at bedtime for cough and congestion.  It will make you drowsy so do not take it during the day.  Use the albuterol inhaler with the spacer, 2 puffs every 4-6 hours as needed for wheezing.   Return for reevaluation or see your primary care provider for any new or worsening symptoms.

## 2021-06-01 NOTE — ED Provider Notes (Signed)
[facility]    CSN: [id number] Arrival date & time: 06/01/21  1001      History   Chief Complaint Chief Complaint  Patient presents with   Cough   Headache    HPI Benjamin Hood is a 70 y.o. male.   HPI  70 year old male here for evaluation of respiratory complaints.  Patient reports that he has had a cough for 2 weeks with a green sputum production for the last week.  His cough increases at night.  He does endorse sinus pressure and clear nasal discharge but states that this is normal for him.  He is also had some mild ear pressure and wheezing.  He denies fever, sore throat, or shortness of breath.  Past Medical History:  Diagnosis Date   Hepatitis B    PVC (premature ventricular contraction)     There are no problems to display for this patient.   Past Surgical History:  Procedure Laterality Date   HERNIA REPAIR Right    inguinal   VASECTOMY     VASECTOMY REVERSAL         Home Medications    Prior to Admission medications   Medication Sig Start Date End Date Taking? Authorizing Provider  albuterol (VENTOLIN HFA) 108 (90 Base) MCG/ACT inhaler Inhale 2 puffs into the lungs every 4 (four) hours as needed. 06/01/21  Yes Becky Augusta, NP  atorvastatin (LIPITOR) 40 MG tablet Take 40 mg by mouth daily.   Yes [provider]  azithromycin (ZITHROMAX Z-PAK) 250 MG tablet Take 1 tablet (250 mg total) by mouth daily. Take 2 tablets on the first day and then 1 tablet daily thereafter for a total of 5 days of treatment. 06/01/21  Yes Becky Augusta, NP  benzonatate (TESSALON) 100 MG capsule Take 2 capsules (200 mg total) by mouth every 8 (eight) hours. 06/01/21  Yes Becky Augusta, NP  promethazine-dextromethorphan (PROMETHAZINE-DM) 6.25-15 MG/5ML syrup Take 5 mLs by mouth 4 (four) times daily as needed. 06/01/21  Yes Becky Augusta, NP  Spacer/Aero-Holding Chambers (AEROCHAMBER MV) inhaler Use as instructed 06/01/21  Yes Becky Augusta, NP  dicyclomine (BENTYL)  10 MG capsule Take 1 capsule (10 mg total) by mouth 4 (four) times daily for 3 days. 01/09/21 01/12/21  Merwyn Katos, MD    Family History History reviewed. No pertinent family history.  Social History Social History   Tobacco Use   Smoking status: Never   Smokeless tobacco: Never  Substance Use Topics   Alcohol use: Yes   Drug use: No     Allergies   Latex   Review of Systems Review of Systems  Constitutional:  Negative for activity change, appetite change and fever.  HENT:  Positive for congestion, ear pain, rhinorrhea and sinus pressure. Negative for sore throat.   Respiratory:  Positive for cough and wheezing. Negative for shortness of breath.   Hematological: Negative.   Psychiatric/Behavioral: Negative.      Physical Exam Triage Vital Signs ED Triage Vitals  Enc Vitals Group     BP 06/01/21 1009 115/75     Pulse Rate 06/01/21 1009 63     Resp 06/01/21 1009 16     Temp 06/01/21 1009 98.5 F (36.9 C)     Temp Source 06/01/21 1009 Oral     SpO2 06/01/21 1009 95 %     Weight --      Height --      Head Circumference --      Peak Flow --  Pain Score 06/01/21 1008 0     Pain Loc --      Pain Edu? --      Excl. in GC? --    No data found.  Updated Vital Signs BP 115/75   Pulse 63   Temp 98.5 F (36.9 C) (Oral)   Resp 16   SpO2 95%   Visual Acuity Right Eye Distance:   Left Eye Distance:   Bilateral Distance:    Right Eye Near:   Left Eye Near:    Bilateral Near:     Physical Exam Vitals and nursing note reviewed.  Constitutional:      General: He is not in acute distress.    Appearance: Normal appearance. He is not ill-appearing.  HENT:     Head: Normocephalic and atraumatic.     Right Ear: Tympanic membrane, ear canal and external ear normal. There is no impacted cerumen.     Left Ear: Tympanic membrane, ear canal and external ear normal. There is no impacted cerumen.     Nose: Nose normal. No congestion or rhinorrhea.      Mouth/Throat:     Mouth: Mucous membranes are moist.     Pharynx: Oropharynx is clear. No posterior oropharyngeal erythema.  Cardiovascular:     Rate and Rhythm: Normal rate and regular rhythm.     Pulses: Normal pulses.     Heart sounds: Normal heart sounds. No murmur heard.   No gallop.  Pulmonary:     Effort: Pulmonary effort is normal.     Breath sounds: Normal breath sounds. No wheezing, rhonchi or rales.  Musculoskeletal:     Cervical back: Normal range of motion and neck supple.  Skin:    General: Skin is warm and dry.     Capillary Refill: Capillary refill takes less than 2 seconds.     Findings: No erythema or rash.  Neurological:     General: No focal deficit present.     Mental Status: He is alert and oriented to person, place, and time.  Psychiatric:        Mood and Affect: Mood normal.        Behavior: Behavior normal.        Thought Content: Thought content normal.        Judgment: Judgment normal.     UC Treatments / Results  Labs (all labs ordered are listed, but only abnormal results are displayed) Labs Reviewed - No data to display  EKG   Radiology No results found.  Procedures Procedures (including critical care time)  Medications Ordered in UC Medications - No data to display  Initial Impression / Assessment and Plan / UC Course  I have reviewed the triage vital signs and the nursing notes.  Pertinent labs & imaging results that were available during my care of the patient were reviewed by me and considered in my medical decision making (see chart for details).  Patient is a very pleasant, nontoxic-appearing 70 year old male here for evaluation of respiratory complaints of being on for the past 2 weeks and include a cough that became productive for a green sputum a week ago and wheezing.  The cough does increase at night.  He has sinus pressure and clear nasal discharge but this is normal for him as he has a history of sinus issues.  He has not  had a fever, sore throat, shortness of breath.  He states that this is a common occurrence for him and typically resolves with  a Z-Pak and some cough medicine.  Patient's physical exam reveals pearly gray tympanic membranes bilaterally with a normal light reflex and clear external auditory canals.  Nasal mucosa is pink and moist without discharge and there is no tenderness to percussion of the maxillary or frontal sinuses.  Oropharyngeal exam is benign.  Cardiopulmonary exam reveals scattered wheezes throughout the lung fields without the presence of rhonchi or rails.  Will treat patient with Z-Pak, albuterol inhaler with spacer to with wheezing, Tessalon Perles, and Promethazine DM cough syrup help with cough.   Final Clinical Impressions(s) / UC Diagnoses   Final diagnoses:  Cough     Discharge Instructions      Take the Z-pack as directed.  Use the Tessalon Perles every 8 hours during the day.  Take them with a small sip of water.  They may give you some numbness to the base of your tongue or a metallic taste in your mouth, this is normal.  Use the Promethazine DM cough syrup at bedtime for cough and congestion.  It will make you drowsy so do not take it during the day.  Use the albuterol inhaler with the spacer, 2 puffs every 4-6 hours as needed for wheezing.   Return for reevaluation or see your primary care provider for any new or worsening symptoms.      ED Prescriptions     Medication Sig Dispense Auth. Provider   azithromycin (ZITHROMAX Z-PAK) 250 MG tablet Take 1 tablet (250 mg total) by mouth daily. Take 2 tablets on the first day and then 1 tablet daily thereafter for a total of 5 days of treatment. 6 tablet Becky Augusta, NP   benzonatate (TESSALON) 100 MG capsule Take 2 capsules (200 mg total) by mouth every 8 (eight) hours. 21 capsule Becky Augusta, NP   promethazine-dextromethorphan (PROMETHAZINE-DM) 6.25-15 MG/5ML syrup Take 5 mLs by mouth 4 (four) times daily as needed.  118 mL Becky Augusta, NP   albuterol (VENTOLIN HFA) 108 (90 Base) MCG/ACT inhaler Inhale 2 puffs into the lungs every 4 (four) hours as needed. 18 g Becky Augusta, NP   Spacer/Aero-Holding Chambers (AEROCHAMBER MV) inhaler Use as instructed 1 each Becky Augusta, NP      PDMP not reviewed this encounter.   Becky Augusta, NP 06/01/21 1029

## 2021-06-01 NOTE — ED Triage Notes (Signed)
Cough for 2 weeks, is now producing green phlegm.   Headache initially, but this resolved days ago with tylenol.   Has not had COVID testing

## 2021-06-07 ENCOUNTER — Ambulatory Visit (INDEPENDENT_AMBULATORY_CARE_PROVIDER_SITE_OTHER): Payer: Medicare HMO

## 2021-06-07 ENCOUNTER — Ambulatory Visit
Admission: EM | Admit: 2021-06-07 | Discharge: 2021-06-07 | Disposition: A | Discharge status: Home / Self Care | Payer: Medicare HMO | Attending: Physician Assistant | Admitting: Physician Assistant

## 2021-06-07 ENCOUNTER — Encounter: Payer: Self-pay | Admitting: Emergency Medicine

## 2021-06-07 ENCOUNTER — Telehealth: Payer: Self-pay | Admitting: Emergency Medicine

## 2021-06-07 ENCOUNTER — Other Ambulatory Visit: Payer: Self-pay

## 2021-06-07 DIAGNOSIS — R059 Cough, unspecified: Secondary | ICD-10-CM

## 2021-06-07 DIAGNOSIS — J209 Acute bronchitis, unspecified: Secondary | ICD-10-CM | POA: Diagnosis not present

## 2021-06-07 MED ORDER — PREDNISONE 20 MG PO TABS: 40.0000 mg | ORAL_TABLET | Freq: Every day | ORAL | 0 refills | Status: AC

## 2021-06-07 MED ORDER — ALBUTEROL SULFATE HFA 108 (90 BASE) MCG/ACT IN AERS: 1.0000 | INHALATION_SPRAY | Freq: Four times a day (QID) | RESPIRATORY_TRACT | 0 refills | Status: AC | PRN

## 2021-06-07 MED ORDER — HYDROCOD POLST-CPM POLST ER 10-8 MG/5ML PO SUER: 5.0000 mL | Freq: Two times a day (BID) | ORAL | 0 refills | Status: AC | PRN

## 2021-06-07 MED ORDER — HYDROCOD POLST-CPM POLST ER 10-8 MG/5ML PO SUER: 5.0000 mL | Freq: Every evening | ORAL | 0 refills | Status: DC | PRN

## 2021-06-07 NOTE — ED Triage Notes (Signed)
Pt c/o cough. He finished his round of antibiotics, and cough syrup. Pt states he still l has a persistent  cough. Denies fever.

## 2021-06-07 NOTE — Discharge Instructions (Addendum)
BRONCHITIS: Your x-ray is clear today. No evidence of pneumonia. Given the duration of symptoms you likely have viral bronchitis, but as we discussed a cough can linger for 4-6 weeks. Treatment is largely supportive and symptom management. Take Mucinex D throughout the day and drink plenty of fluids to break up the mucus . Take cough syrup at bedtime if needed, for cough and to assist with sleep. Take Ibuprofen or other NSAID for relief of any pleuritic pain. Increase rest and fluid intake. Return to the clinic, your PCP, or ER if you have any new/ worsening symptoms such as fever, chest pain, difficulty breathing, worsening cough, mental status changes, lethargy, etc.

## 2021-06-07 NOTE — ED Provider Notes (Signed)
[facility]    CSN: [id number] Arrival date & time: 06/07/21  1433      History   Chief Complaint Chief Complaint  Patient presents with   Cough    HPI Benjamin Hood is a 69 y.o. male presenting for 3-week history of cough that is largely unproductive.  Patient was seen at [facility] urgent care 6 days ago and treated with azithromycin and Promethazine DM.  Patient also was prescribed benzonatate and albuterol but states his insurance did not cover it the albuterol and he denies any shortness of breath.  Patient did not take the benzonatate either.  He denies any fevers or fatigue.  States sometimes the cough will keep him up at night.  He does report that he takes Delsym during the day and the Promethazine DM at bedtime.  He says he is unsure if the Promethazine DM has helped.  He does not feel any better or worse than when he was seen in the urgent care 6 days ago.  Denies any COVID exposure at any point.  Denies any worsening of symptoms.  Patient has a history of allergies.  States that he normally takes Claritin-D but stopped taking that about 2 to 3 weeks ago.  He has no history of asthma, COPD or other respiratory disease.  He denies any other complaints.  HPI  Past Medical History:  Diagnosis Date   Hepatitis B    PVC (premature ventricular contraction)     There are no problems to display for this patient.   Past Surgical History:  Procedure Laterality Date   HERNIA REPAIR Right    inguinal   VASECTOMY     VASECTOMY REVERSAL         Home Medications    Prior to Admission medications   Medication Sig Start Date End Date Taking? Authorizing Provider  albuterol (VENTOLIN HFA) 108 (90 Base) MCG/ACT inhaler Inhale 1-2 puffs into the lungs every 6 (six) hours as needed for wheezing or shortness of breath. 06/07/21  Yes Eusebio Friendly B, PA-C  atorvastatin (LIPITOR) 40 MG tablet Take 40 mg by mouth daily.   Yes [provider]   chlorpheniramine-HYDROcodone (TUSSIONEX PENNKINETIC ER) 10-8 MG/5ML SUER Take 5 mLs by mouth at bedtime as needed for cough. 06/07/21  Yes Shirlee Latch, PA-C  predniSONE (DELTASONE) 20 MG tablet Take 2 tablets (40 mg total) by mouth daily for 5 days. 06/07/21 06/12/21 Yes Shirlee Latch, PA-C  Spacer/Aero-Holding Chambers (AEROCHAMBER MV) inhaler Use as instructed 06/01/21  Yes Becky Augusta, NP  dicyclomine (BENTYL) 10 MG capsule Take 1 capsule (10 mg total) by mouth 4 (four) times daily for 3 days. 01/09/21 01/12/21  Merwyn Katos, MD    Family History No family history on file.  Social History Social History   Tobacco Use   Smoking status: Never   Smokeless tobacco: Never  Vaping Use   Vaping Use: Never used  Substance Use Topics   Alcohol use: Yes   Drug use: No     Allergies   Latex   Review of Systems Review of Systems  Constitutional:  Negative for fatigue and fever.  HENT:  Positive for congestion. Negative for rhinorrhea, sinus pressure, sinus pain and sore throat.   Respiratory:  Positive for cough. Negative for shortness of breath.   Gastrointestinal:  Negative for abdominal pain, diarrhea, nausea and vomiting.  Musculoskeletal:  Negative for myalgias.  Neurological:  Negative for weakness, light-headedness and headaches.  Hematological:  Negative  for adenopathy.    Physical Exam Triage Vital Signs ED Triage Vitals  Enc Vitals Group     BP 06/07/21 1520 119/85     Pulse Rate 06/07/21 1520 68     Resp 06/07/21 1520 18     Temp 06/07/21 1520 98.4 F (36.9 C)     Temp Source 06/07/21 1520 Oral     SpO2 06/07/21 1520 99 %     Weight 06/07/21 1518 190 lb 0.6 oz (86.2 kg)     Height 06/07/21 1518 5\' 10"  (1.778 m)     Head Circumference --      Peak Flow --      Pain Score 06/07/21 1517 4     Pain Loc --      Pain Edu? --      Excl. in GC? --    No data found.  Updated Vital Signs BP 119/85 (BP Location: Left Arm)   Pulse 68   Temp 98.4 F (36.9 C)  (Oral)   Resp 18   Ht 5\' 10"  (1.778 m)   Wt 190 lb 0.6 oz (86.2 kg)   SpO2 99%   BMI 27.27 kg/m      Physical Exam Vitals and nursing note reviewed.  Constitutional:      General: He is not in acute distress.    Appearance: Normal appearance. He is well-developed. He is not ill-appearing or diaphoretic.  HENT:     Head: Normocephalic and atraumatic.     Right Ear: Tympanic membrane, ear canal and external ear normal.     Left Ear: Tympanic membrane, ear canal and external ear normal.     Nose: Nose normal.     Mouth/Throat:     Mouth: Mucous membranes are moist.     Pharynx: Oropharynx is clear. Uvula midline.     Tonsils: No tonsillar abscesses.  Eyes:     General: No scleral icterus.       Right eye: No discharge.        Left eye: No discharge.     Conjunctiva/sclera: Conjunctivae normal.  Neck:     Thyroid: No thyromegaly.     Trachea: No tracheal deviation.  Cardiovascular:     Rate and Rhythm: Normal rate and regular rhythm.     Heart sounds: Normal heart sounds.  Pulmonary:     Effort: Pulmonary effort is normal. No respiratory distress.     Breath sounds: Normal breath sounds. No wheezing, rhonchi or rales.  Musculoskeletal:     Cervical back: Normal range of motion and neck supple.  Skin:    General: Skin is warm and dry.     Findings: No rash.  Neurological:     General: No focal deficit present.     Mental Status: He is alert. Mental status is at baseline.     Motor: No weakness.     Gait: Gait normal.  Psychiatric:        Mood and Affect: Mood normal.        Thought Content: Thought content normal.     UC Treatments / Results  Labs (all labs ordered are listed, but only abnormal results are displayed) Labs Reviewed - No data to display  EKG   Radiology DG Chest 2 View  Result Date: 06/07/2021 CLINICAL DATA:  Cough for 3 weeks. EXAM: CHEST - 2 VIEW COMPARISON:  None. FINDINGS: The cardiomediastinal contours are normal. Atherosclerosis of the  aortic arch. Biapical pleuroparenchymal scarring. Pulmonary vasculature is normal. No  consolidation, pleural effusion, or pneumothorax. No acute osseous abnormalities are seen. IMPRESSION: No acute chest findings or explanation for cough. Aortic Atherosclerosis (ICD10-I70.0). Electronically Signed   By: Narda RutherfordMelanie  Sanford M.D.   On: 06/07/2021 16:01    Procedures Procedures (including critical care time)  Medications Ordered in UC Medications - No data to display  Initial Impression / Assessment and Plan / UC Course  I have reviewed the triage vital signs and the nursing notes.  Pertinent labs & imaging results that were available during my care of the patient were reviewed by me and considered in my medical decision making (see chart for details).  70 y/o male presenting for 3-week history of nonproductive cough and congestion.  Patient seen in the clinic 6 days ago and prescribed azithromycin and Promethazine DM for symptoms.  Denies any improvement or worsening of symptoms.  No fever, chest pain or shortness of prescription  Vital signs are normal and stable.  He is afebrile.  Oxygen is 99%.  Exam is benign today.  His chest is clear to auscultation heart regular rate and rhythm.  Chest x-ray obtained today symptoms not obtained last week.  Chest x-ray independently viewed by me.  X-ray negative for any acute abnormalities.  Reviewed this with patient.  Suspect viral bronchitis.  Advised him not viral bronchitis symptoms and especially the cough can last for 4 to 6 weeks.  Encouraged him to increase rest and fluid intake.  I did offer him prednisone which I prescribed.  I sent in albuterol inhaler and hopefully insurance covers at this time.  I am unsure as to why they did not before but he is not having any shortness of breath so I have advised him that he does not really need it.  I did send Tussionex for him to take at bedtime after reviewing controlled substance database.  Advised DayQuil or  Delsym during the day is fine.  Thoroughly reviewed when to return and when to go to ED.  Advised patient to return here or see PCP if he develops a fever, worsening cough, chest pain or increased breathing difficulty otherwise, the cough can last another couple of weeks.   Final Clinical Impressions(s) / UC Diagnoses   Final diagnoses:  Acute bronchitis, unspecified organism  Cough     Discharge Instructions      BRONCHITIS: Your x-ray is clear today. No evidence of pneumonia. Given the duration of symptoms you likely have viral bronchitis, but as we discussed a cough can linger for 4-6 weeks. Treatment is largely supportive and symptom management. Take Mucinex D throughout the day and drink plenty of fluids to break up the mucus . Take cough syrup at bedtime if needed, for cough and to assist with sleep. Take Ibuprofen or other NSAID for relief of any pleuritic pain. Increase rest and fluid intake. Return to the clinic, your PCP, or ER if you have any new/ worsening symptoms such as fever, chest pain, difficulty breathing, worsening cough, mental status changes, lethargy, etc.           ED Prescriptions     Medication Sig Dispense Auth. Provider   albuterol (VENTOLIN HFA) 108 (90 Base) MCG/ACT inhaler Inhale 1-2 puffs into the lungs every 6 (six) hours as needed for wheezing or shortness of breath. 1 each Shirlee LatchEaves, Joshaua Epple B, PA-C   predniSONE (DELTASONE) 20 MG tablet Take 2 tablets (40 mg total) by mouth daily for 5 days. 10 tablet Shirlee LatchEaves, Lurene Robley B, PA-C   chlorpheniramine-HYDROcodone (TUSSIONEX  PENNKINETIC ER) 10-8 MG/5ML SUER Take 5 mLs by mouth at bedtime as needed for cough. 70 mL Shirlee Latch, PA-C      I have reviewed the PDMP during this encounter.   Shirlee Latch, PA-C 06/07/21 1626

## 2021-10-20 IMAGING — CR DG CHEST 2V
2 series · 2 of 2 positions shown · non-contrast
Comparison: None.

CLINICAL DATA: Cough for 3 weeks.

EXAM:
CHEST - 2 VIEW

[chest pa]
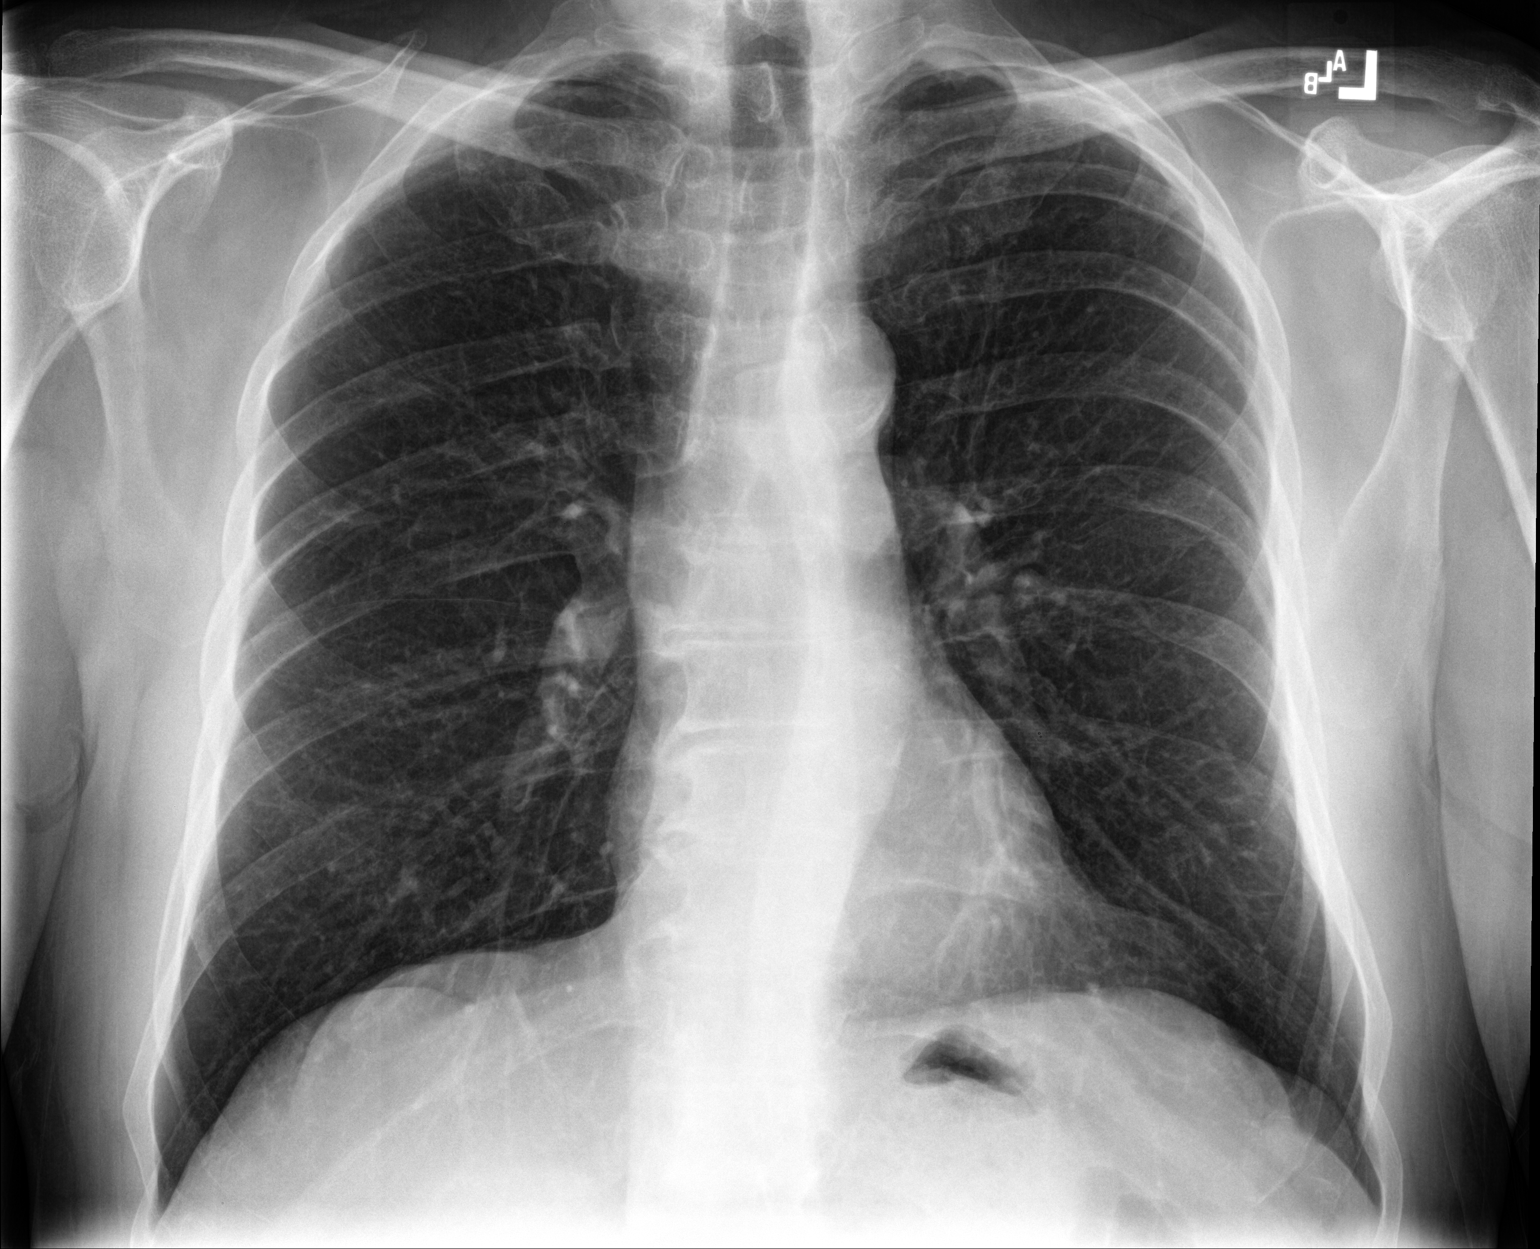

[chest lat]
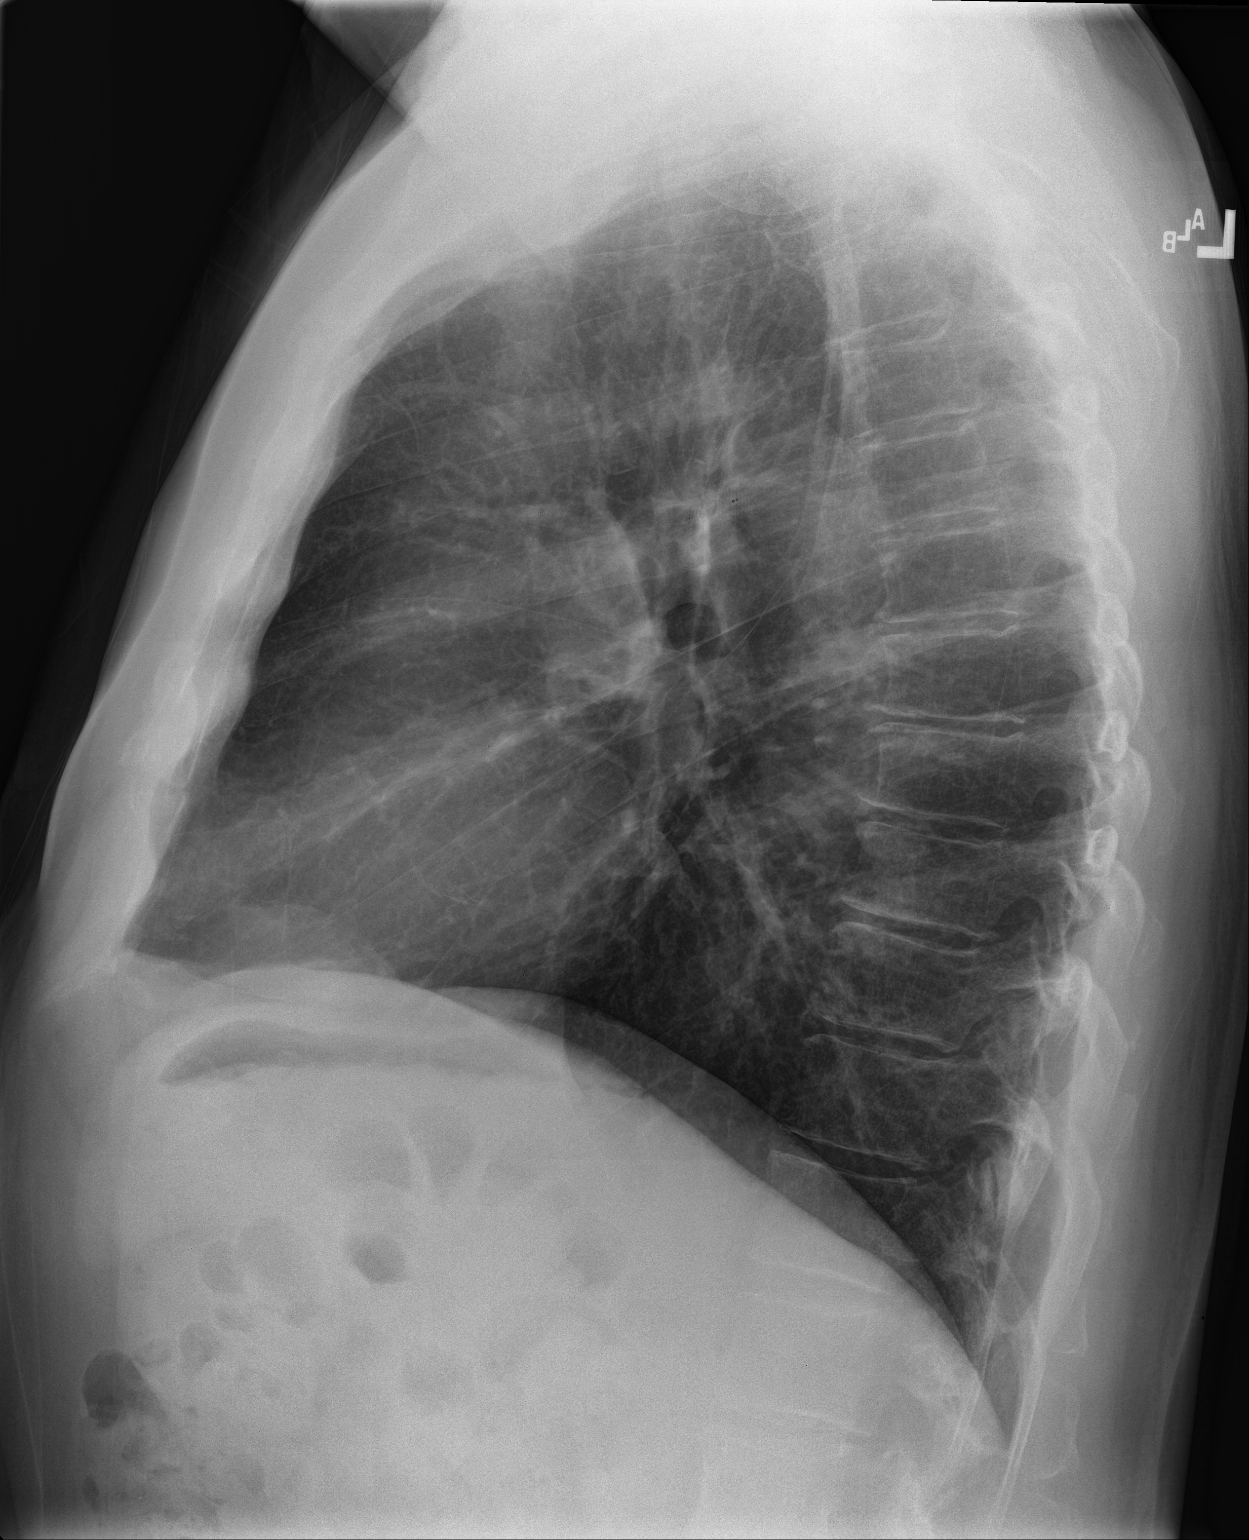

[2 of 2 positions shown; findings below may reference images not displayed]

FINDINGS: The cardiomediastinal contours are normal. Atherosclerosis of the
aortic arch. Biapical pleuroparenchymal scarring. Pulmonary
vasculature is normal. No consolidation, pleural effusion, or
pneumothorax. No acute osseous abnormalities are seen.
IMPRESSION: No acute chest findings or explanation for cough.

Aortic Atherosclerosis ([id number]).
